# Patient Record
Sex: Male | Born: 1960 | Race: White | Hispanic: No | Marital: Married | Smoking: Former smoker
Health system: Southern US, Community
[De-identification: ages and names within clinical notes are randomized; demographics above are authoritative.]

## PROBLEM LIST (undated history)

## (undated) DIAGNOSIS — E785 Hyperlipidemia, unspecified: Secondary | ICD-10-CM

## (undated) DIAGNOSIS — M5030 Other cervical disc degeneration, unspecified cervical region: Secondary | ICD-10-CM

## (undated) DIAGNOSIS — E079 Disorder of thyroid, unspecified: Secondary | ICD-10-CM

## (undated) DIAGNOSIS — E559 Vitamin D deficiency, unspecified: Secondary | ICD-10-CM

## (undated) DIAGNOSIS — M109 Gout, unspecified: Secondary | ICD-10-CM

## (undated) DIAGNOSIS — I10 Essential (primary) hypertension: Secondary | ICD-10-CM

## (undated) HISTORY — PX: OTHER SURGICAL HISTORY: SHX169

## (undated) HISTORY — PX: POLYPECTOMY: SHX149

## (undated) HISTORY — DX: Vitamin D deficiency, unspecified: E55.9

## (undated) HISTORY — DX: Other cervical disc degeneration, unspecified cervical region: M50.30

## (undated) HISTORY — DX: Disorder of thyroid, unspecified: E07.9

## (undated) HISTORY — PX: HERNIA REPAIR: SHX51

## (undated) HISTORY — DX: Hyperlipidemia, unspecified: E78.5

## (undated) HISTORY — PX: BACK SURGERY: SHX140

## (undated) HISTORY — DX: Essential (primary) hypertension: I10

## (undated) HISTORY — DX: Gout, unspecified: M10.9

---

## 2005-07-28 ENCOUNTER — Ambulatory Visit (HOSPITAL_COMMUNITY): Admission: RE | Admit: 2005-07-28 | Discharge: 2005-07-28 | Payer: Self-pay | Admitting: Specialist

## 2006-06-08 ENCOUNTER — Emergency Department (HOSPITAL_COMMUNITY): Admission: EM | Admit: 2006-06-08 | Discharge: 2006-06-09 | Payer: Self-pay | Admitting: Emergency Medicine

## 2006-06-08 ENCOUNTER — Encounter: Admission: RE | Admit: 2006-06-08 | Discharge: 2006-06-08 | Payer: Self-pay | Admitting: Neurosurgery

## 2006-06-12 ENCOUNTER — Ambulatory Visit (HOSPITAL_COMMUNITY): Admission: RE | Admit: 2006-06-12 | Discharge: 2006-06-12 | Payer: Self-pay | Admitting: Neurosurgery

## 2006-07-09 ENCOUNTER — Ambulatory Visit (HOSPITAL_COMMUNITY): Admission: RE | Admit: 2006-07-09 | Discharge: 2006-07-10 | Payer: Self-pay | Admitting: Neurosurgery

## 2006-08-02 ENCOUNTER — Emergency Department (HOSPITAL_COMMUNITY): Admission: EM | Admit: 2006-08-02 | Discharge: 2006-08-02 | Payer: Self-pay | Admitting: Emergency Medicine

## 2006-09-20 ENCOUNTER — Encounter: Admission: RE | Admit: 2006-09-20 | Discharge: 2006-09-20 | Payer: Self-pay | Admitting: Neurosurgery

## 2007-11-01 ENCOUNTER — Ambulatory Visit (HOSPITAL_COMMUNITY): Admission: RE | Admit: 2007-11-01 | Discharge: 2007-11-01 | Payer: Self-pay | Admitting: Specialist
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
## 2009-04-26 ENCOUNTER — Encounter: Admission: RE | Admit: 2009-04-26 | Discharge: 2009-04-26 | Payer: Self-pay | Admitting: Neurosurgery

## 2009-06-18 ENCOUNTER — Ambulatory Visit (HOSPITAL_COMMUNITY): Admission: RE | Admit: 2009-06-18 | Discharge: 2009-06-19 | Payer: Self-pay | Admitting: Neurosurgery

## 2010-04-25 ENCOUNTER — Ambulatory Visit (HOSPITAL_COMMUNITY): Admission: RE | Admit: 2010-04-25 | Discharge: 2010-04-25 | Payer: Self-pay | Admitting: Specialist

## 2010-09-21 ENCOUNTER — Observation Stay (HOSPITAL_COMMUNITY)
Admission: RE | Admit: 2010-09-21 | Discharge: 2010-09-22 | Payer: Self-pay | Source: Home / Self Care | Attending: Specialist | Admitting: Specialist

## 2010-09-21 ENCOUNTER — Encounter (INDEPENDENT_AMBULATORY_CARE_PROVIDER_SITE_OTHER): Payer: Self-pay | Admitting: Specialist

## 2010-12-27 LAB — COMPREHENSIVE METABOLIC PANEL
ALT: 23 U/L (ref 0–53)
AST: 20 U/L (ref 0–37)
Albumin: 4.6 g/dL (ref 3.5–5.2)
Alkaline Phosphatase: 60 U/L (ref 39–117)
BUN: 13 mg/dL (ref 6–23)
CO2: 28 mEq/L (ref 19–32)
Calcium: 10 mg/dL (ref 8.4–10.5)
Chloride: 103 mEq/L (ref 96–112)
Creatinine, Ser: 1.01 mg/dL (ref 0.4–1.5)
GFR calc Af Amer: >60 mL/min (ref >60)
GFR calc non Af Amer: >60 mL/min (ref >60)
Glucose, Bld: 93 mg/dL (ref 70–99)
Potassium: 3.9 mEq/L (ref 3.5–5.1)
Sodium: 140 mEq/L (ref 135–145)
Total Bilirubin: 0.8 mg/dL (ref 0.3–1.2)
Total Protein: 8 g/dL (ref 6.0–8.3)

## 2010-12-27 LAB — PROTIME-INR
INR: 0.97 (ref 0.00–1.49)
Prothrombin Time: 13.1 seconds (ref 11.6–15.2)

## 2010-12-27 LAB — CBC
HCT: 44.9 % (ref 39.0–52.0)
Hemoglobin: 16.4 g/dL (ref 13.0–17.0)
MCH: 32.6 pg (ref 26.0–34.0)
MCHC: 36.5 g/dL — ABNORMAL HIGH (ref 30.0–36.0)
MCV: 89.3 fL (ref 78.0–100.0)
Platelets: 206 10*3/uL (ref 150–400)
RBC: 5.03 MIL/uL (ref 4.22–5.81)
RDW: 12.9 % (ref 11.5–15.5)
WBC: 5.1 10*3/uL (ref 4.0–10.5)

## 2010-12-27 LAB — URINALYSIS, ROUTINE W REFLEX MICROSCOPIC
Bilirubin Urine: NEGATIVE
Glucose, UA: NEGATIVE mg/dL
Hgb urine dipstick: NEGATIVE
Ketones, ur: NEGATIVE mg/dL
Nitrite: NEGATIVE
Protein, ur: NEGATIVE mg/dL
Specific Gravity, Urine: 1.009 (ref 1.005–1.030)
Urobilinogen, UA: 0.2 mg/dL (ref 0.0–1.0)
pH: 6 (ref 5.0–8.0)

## 2010-12-27 LAB — SURGICAL PCR SCREEN
MRSA, PCR: NEGATIVE
Staphylococcus aureus: NEGATIVE

## 2011-01-20 LAB — BASIC METABOLIC PANEL
CO2: 28 mEq/L (ref 19–32)
Calcium: 10.1 mg/dL (ref 8.4–10.5)
Chloride: 103 mEq/L (ref 96–112)
Creatinine, Ser: 0.95 mg/dL (ref 0.4–1.5)
GFR calc Af Amer: >60 mL/min (ref >60)
Glucose, Bld: 80 mg/dL (ref 70–99)

## 2011-01-20 LAB — CBC
Hemoglobin: 16 g/dL (ref 13.0–17.0)
MCHC: 35.6 g/dL (ref 30.0–36.0)
MCV: 93.1 fL (ref 78.0–100.0)
RBC: 4.83 MIL/uL (ref 4.22–5.81)
RDW: 12.8 % (ref 11.5–15.5)

## 2011-01-20 LAB — DIFFERENTIAL
Basophils Absolute: 0 10*3/uL (ref 0.0–0.1)
Basophils Relative: 1 % (ref 0–1)
Eosinophils Absolute: 0.1 10*3/uL (ref 0.0–0.7)
Monocytes Absolute: 0.4 10*3/uL (ref 0.1–1.0)
Monocytes Relative: 8 % (ref 3–12)
Neutro Abs: 3.4 10*3/uL (ref 1.7–7.7)

## 2011-03-03 NOTE — Op Note (Signed)
NAME:  Sean Bell, Sean Bell NO.:  [id number]   MEDICAL RECORD NO.:  [id number]          PATIENT TYPE:  OIB   LOCATION:  3039                         FACILITY:  [facility]   PHYSICIAN:  Kathaleen Maser. Pool, M.D.    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  Right C6-7  herniated pulposus with radiculopathy.   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  Right C6-7  herniated pulposus with radiculopathy.   PROCEDURE:  C5-6 at C6-7 anterior cervical diskectomy and fusion with  allograft and plating.   SURGEON:  Kathaleen Maser. Pool, M.D.   ASSISTANT:  Donalee Citrin, M.D.   ANESTHESIA:  General orotracheal.   INDICATIONS FOR PROCEDURE:  Mr. Sheek is a 50 year old male with history of  severe neck and right upper extremity pain, paresthesias, and weakness  consistent primarily with a right-sided C7 radiculopathy, although there are  some elements of a right-sided C6 radiculopathy as well.  Workup  demonstrates evidence of significant spondylosis with foraminal stenosis at  C5-6.  The patient has evidence of a rather large preforaminal and foraminal  disk herniation off the right-side at C6-7 causing compression of the right-  sided T7 nerve root.  The patient has been counseled as to his options.  He  decided to proceed with a C5-6 and C6-7 anterior cervical diskectomy and  fusion with allograft and plating.   DESCRIPTION OF PROCEDURE:  The patient was taken to the operating room and  placed on the operative table in the supine position.  After adequate level  of anesthesia was achieved, the patient was positioned with the neck  slightly and head placed in halter traction.  The patient's anterior  cervical region was prepped and draped sterilely.  A 10 blade was used to  make a skin incision overlying the C6 vertebral level.   This was carried  down sharply to the platysma.  The platysma was then divided vertically, and  dissection proceeded along the medial border of the sternomastoid muscle and  carotid sheath.  The trachea and esophagus were mobilized and retracted  towards the left.  The prevertebral fascia was stripped off the anterior  spinal column.  The longus colli muscle was then elevated bilaterally.  A  deep self-retaining retractor was placed.  Intraoperative fluoroscopy was  used, and the C5-6 and C6-7 levels were confirmed.  Both disk spaces were  then incised with a 15 blade in rectangular fashion.  A wide disk space  clean-out was then achieved using pituitary rongeurs, forward and back-  angled Karlin curettes, Kerrison rongeurs, and a high-speed drill.  All  elements of the disk were removed down to the level of the posterior  annulus.  The microscope was then brought into the field and used throughout  the remainder of the diskectomy.  The remaining aspects of the annulus and  osteophytes were removed using the high-speed drill down to the level of the  posterior longitudinal ligament.  Posterior longitudinal ligament was then  elevated and resected in piecemeal fashion using  Kerrison rongeurs.  The  thecal sac was then identified.  A wide central decompression was then  performed by undercutting the bodies of C5-C6.  The decompression then  proceeded on each neural foramen.  Wide anterior foraminotomy was then  performed on first the exiting C6 nerve roots bilaterally.  At this point a  very thorough decompression had been achieved.  There is no evidence of  injury to the thecal sac or nerve roots.  The procedure then repeated at C6-  7, again without complication.  The findings at C6-7 were that of a rather  large rightward C6-7 disk herniation with compression to the right-sided C7  nerve root.  The wound was then irrigated with antibiotic solution.  Gelfoam  was placed topically for  hemostasis, and later Surgifoam was also used for  hemostasis which was finally achieved.  A 5-mm LifeNet allograft wedge was  then packed into place and recessed approximately 1 mL from the anterior  cortical margin.  At C5-6, a 6-mm LifeNet allograft wedge was then packed  into place at C6-7.  A 45-mm Atlantis anterior cervical plate was then  placed at the C5, C6, and C7 levels.  This then attached under fluoroscopic  guidance using 13-mm bare metal screws.  All screws were given final  tightening and found be solidly in bone.  The locking screws were engaged at  all three levels.  The final images revealed good position of the bone  grafts and the hardware at the proper operative levels and normal alignment  of the spine.  The wound was then irrigated with antibiotic solution.  Hemostasis was ensured.  The wound was then closed in typical fashion.  Steri-Strips and sterile dressings were applied.  There were no operative  complications.  The patient awoke and he returned to the recovery room  postoperatively.           ______________________________  Kathaleen Maser Pool, M.D.     HAP/MEDQ  D:  07/09/2006  T:  07/11/2006  Job:  [id number]

## 2011-03-05 NOTE — Discharge Summary (Signed)
  NAME:  Sean Bell, Sean Bell NO.:  [id number]  MEDICAL RECORD NO.:  [id number]           PATIENT TYPE:  LOCATION:                                 FACILITY:  PHYSICIAN:  Jene Every, M.D.    DATE OF BIRTH:  06/21/1961  DATE OF ADMISSION:  09/21/2010 DATE OF DISCHARGE:  09/22/2010                              DISCHARGE SUMMARY   ADMISSION DIAGNOSIS:  Spinal stenosis and herniated nucleus pulposus at L4-L5 on the left.  DISCHARGE DIAGNOSIS:  Spinal stenosis and herniated nucleus pulposus at L4-L5 on the left status post lumbar decompression with microdiskectomy at L4-L5 on the left.  PROCEDURE:  The patient was taken to the OR, underwent lateral recess decompression with foraminotomy at L4-L5 on the left with microdiskectomy at L4-L5 on the left.  Surgeon was Dr. Jene Every. Assistant was [employer], [employer].  Anesthesia was general. Complications were none.  Hospital course was uneventful. DISPOSITION:  The patient stable to be discharged home on postop day #1. He is to follow up with Dr. Shelle Iron in approximately 10 to 14 days for suture removal.  ACTIVITIES:  He is to walk as tolerated utilizing back precautions.  DISCHARGE MEDICATIONS:  As per med rec sheet.  CONDITION ON DISCHARGE:  Stable.  FINAL DIAGNOSIS:  Doing well status post lumbar decompression and microdiskectomy at L4-L5 on the left.     Roma Schanz, P.A.   ______________________________ Jene Every, M.D.    CS/MEDQ  D:  01/17/2011  T:  01/17/2011  Job:  [id number]  Electronically Signed by Roma Schanz P.A. on 02/24/2011 09:25:31 AM Electronically Signed by Jene Every M.D. on 03/05/2011 01:29:25 PM

## 2012-01-22 IMAGING — CR DG LUMBAR SPINE 2-3V
2 series · 2 of 2 positions shown · non-contrast
Comparison: None.

CLINICAL DATA: Preoperative exam.  Spinal stenosis.

LUMBAR SPINE - 2-3 VIEW

[t l-spine a.p.]
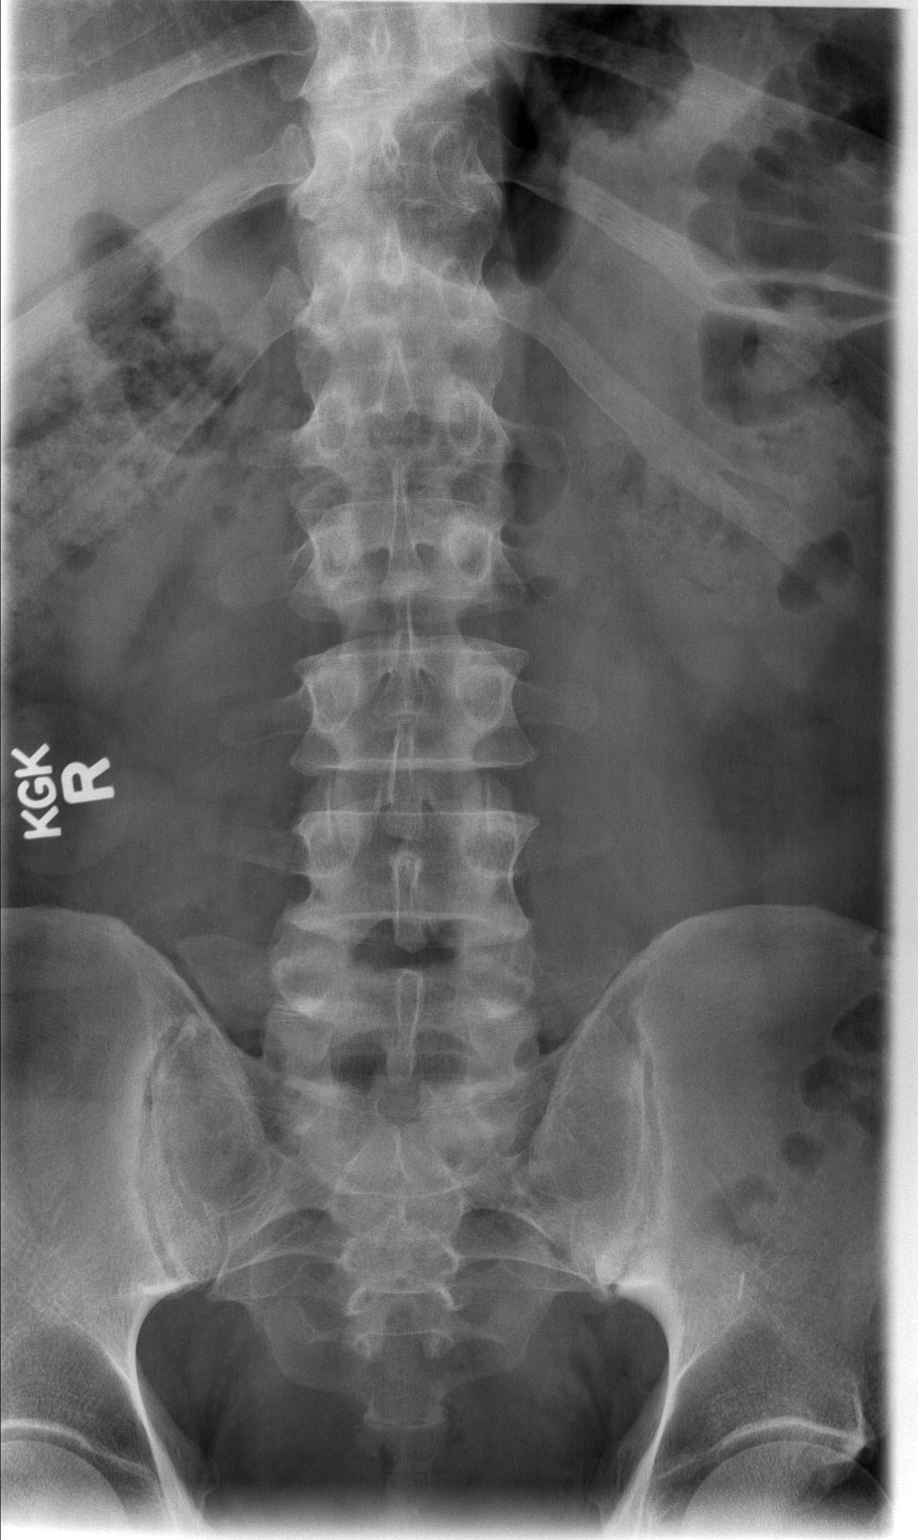

[t l-spine lat]
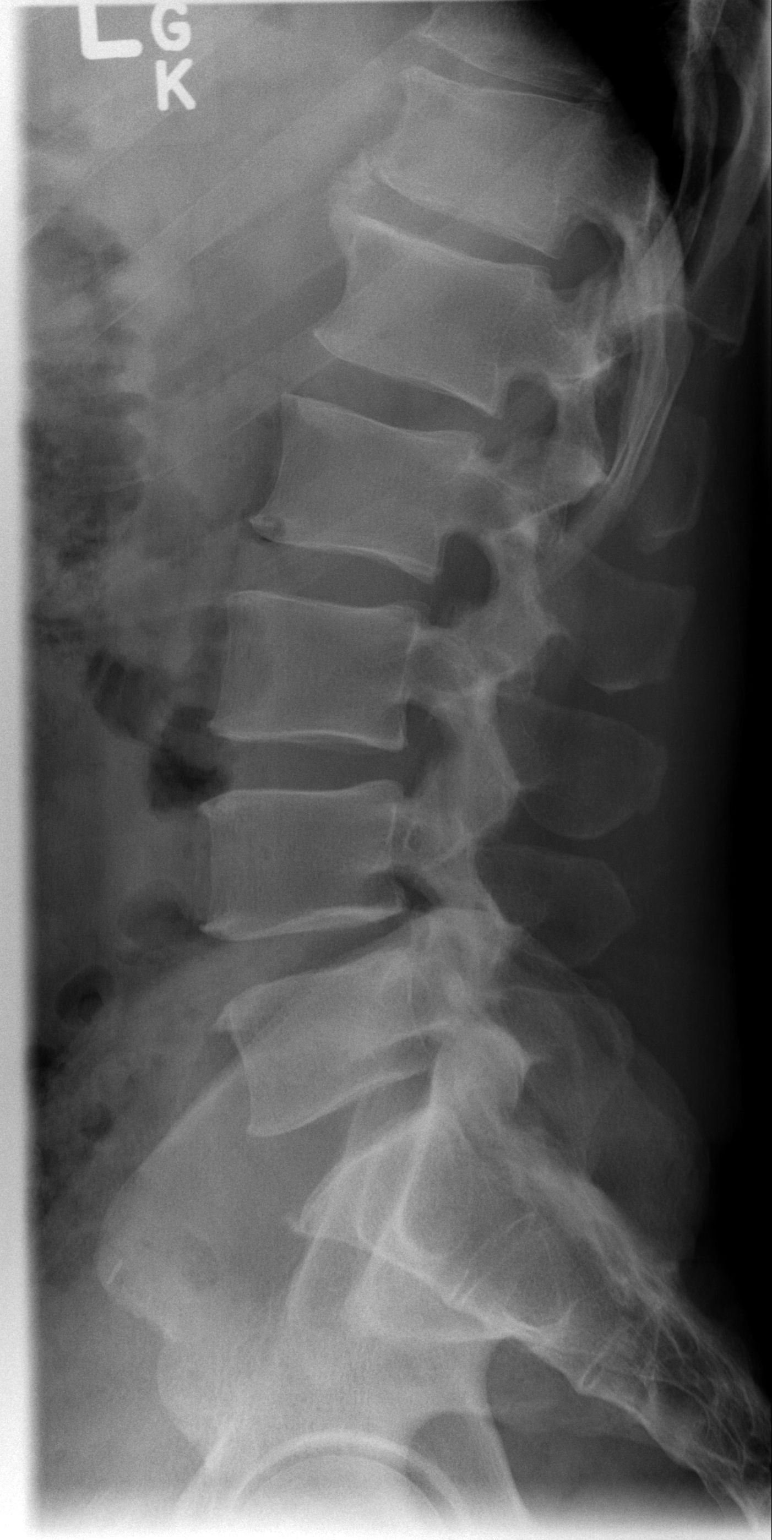

[2 of 2 positions shown; findings below may reference images not displayed]

FINDINGS: The present exam is labeled with the last fully open disc
space L5-S1.  Correlation with any outside films recommended prior
to any intervention.

Mild L4-5 disc space narrowing.  The canal appears congenitally
narrowed at the L4-5 and L5-S1 level.  Minimal degenerative changes
T11-12 and T12-L1.

Mild left-sided sacroiliac joint degenerative changes.  Surgical
clip projects over the left ilium.
IMPRESSION: The present exam is labeled with the last fully open disc space L5-
S1.  Correlation with any outside films recommended prior to any
intervention.

## 2014-02-13 DIAGNOSIS — M792 Neuralgia and neuritis, unspecified: Secondary | ICD-10-CM | POA: Insufficient documentation

## 2014-02-13 DIAGNOSIS — M961 Postlaminectomy syndrome, not elsewhere classified: Secondary | ICD-10-CM | POA: Insufficient documentation

## 2014-02-13 DIAGNOSIS — G894 Chronic pain syndrome: Secondary | ICD-10-CM | POA: Insufficient documentation

## 2021-02-03 ENCOUNTER — Telehealth: Payer: Self-pay | Admitting: Oncology

## 2021-02-03 NOTE — Telephone Encounter (Signed)
Patient referred by Terrilyn Saver, NP for Atypical Lymphocytes.  Appt made for 02/11/21 Labs 10:30 am - Consult 11:00 am

## 2021-02-03 NOTE — Telephone Encounter (Signed)
Per Alvis Lemmings at [facility] [employer] [facility] [employer], patient does not need Authorization for this Visit.  Reference Number [id number]

## 2021-02-09 ENCOUNTER — Telehealth: Payer: Self-pay | Admitting: Oncology

## 2021-02-09 NOTE — Telephone Encounter (Signed)
Patient rescheduled from 4/29 to 5/5 Labs 2:45 pm - Consult 3:15 pm for Atypical Lymphocytes

## 2021-02-11 ENCOUNTER — Other Ambulatory Visit: Payer: Self-pay

## 2021-02-11 ENCOUNTER — Encounter: Payer: Self-pay | Admitting: Oncology

## 2021-02-15 ENCOUNTER — Telehealth: Payer: Self-pay | Admitting: Oncology

## 2021-02-15 DIAGNOSIS — K4091 Unilateral inguinal hernia, without obstruction or gangrene, recurrent: Secondary | ICD-10-CM | POA: Insufficient documentation

## 2021-02-15 NOTE — Telephone Encounter (Signed)
Patient referred by Terrilyn Saver, NP for Atypical Lymphocyte.  Appt made 02/17/21 Labs 10:30 am - Consult 11:00 am

## 2021-02-16 ENCOUNTER — Other Ambulatory Visit: Payer: Self-pay | Admitting: Oncology

## 2021-02-16 DIAGNOSIS — D72820 Lymphocytosis (symptomatic): Secondary | ICD-10-CM
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
## 2021-02-17 ENCOUNTER — Other Ambulatory Visit: Payer: Self-pay | Admitting: Hematology and Oncology

## 2021-02-17 ENCOUNTER — Encounter: Payer: Self-pay | Admitting: Oncology

## 2021-02-17 ENCOUNTER — Inpatient Hospital Stay (INDEPENDENT_AMBULATORY_CARE_PROVIDER_SITE_OTHER): Payer: Medicare HMO | Admitting: Oncology

## 2021-02-17 ENCOUNTER — Inpatient Hospital Stay: Payer: Medicare HMO | Attending: Oncology

## 2021-02-17 ENCOUNTER — Other Ambulatory Visit: Payer: Self-pay

## 2021-02-17 DIAGNOSIS — D7289 Other specified disorders of white blood cells: Secondary | ICD-10-CM

## 2021-02-17 DIAGNOSIS — D72820 Lymphocytosis (symptomatic): Secondary | ICD-10-CM

## 2021-02-17 LAB — CBC
MCV: 93 (ref 80–94)
RBC: 4.97 (ref 3.87–5.11)

## 2021-02-17 LAB — CBC AND DIFFERENTIAL
HCT: 16 — AB (ref 41–53)
Hemoglobin: 15.5 (ref 13.5–17.5)
Neutrophils Absolute: 3.48
Platelets: 270 (ref 150–399)
WBC: 5.9

## 2021-03-13 DIAGNOSIS — D7289 Other specified disorders of white blood cells: Secondary | ICD-10-CM | POA: Insufficient documentation

## 2021-04-29 ENCOUNTER — Telehealth: Payer: Self-pay

## 2021-04-29 ENCOUNTER — Other Ambulatory Visit: Payer: Self-pay | Admitting: Neurosurgery

## 2021-04-29 DIAGNOSIS — M5412 Radiculopathy, cervical region: Secondary | ICD-10-CM

## 2021-04-29 NOTE — Progress Notes (Signed)
Phone call to patient to verify medication list and allergies for myelogram procedure. Pt instructed to hold Armodafinil (nuvigil) and  Cymbalta for 48hrs prior to myelogram appointment time and 24 hours after appointment. Pt also instructed to have a driver the day of the procedure, the procedure would take around 2 hours, and discharge instructions discussed. Pt verbalized understanding.

## 2021-05-02 ENCOUNTER — Ambulatory Visit
Admission: RE | Admit: 2021-05-02 | Discharge: 2021-05-02 | Disposition: A | Discharge status: Home / Self Care | Payer: Medicare HMO | Source: Ambulatory Visit | Attending: Neurosurgery | Admitting: Neurosurgery

## 2021-05-02 DIAGNOSIS — M5412 Radiculopathy, cervical region: Secondary | ICD-10-CM

## 2021-05-02 MED ORDER — IOPAMIDOL (ISOVUE-M 300) INJECTION 61%
10.0000 mL | Freq: Once | INTRAMUSCULAR | Status: AC
  Administered 2021-05-02: 10 mL via INTRATHECAL

## 2021-05-02 MED ORDER — DIAZEPAM 5 MG PO TABS
10.0000 mg | ORAL_TABLET | Freq: Once | ORAL | Status: AC
  Administered 2021-05-02: 10 mg via ORAL

## 2021-05-02 MED ORDER — MEPERIDINE HCL 50 MG/ML IJ SOLN
50.0000 mg | Freq: Once | INTRAMUSCULAR | Status: AC
  Administered 2021-05-02: 50 mg via INTRAMUSCULAR

## 2021-05-02 MED ORDER — ONDANSETRON HCL 4 MG/2ML IJ SOLN
4.0000 mg | Freq: Once | INTRAMUSCULAR | Status: AC
  Administered 2021-05-02: 4 mg via INTRAMUSCULAR

## 2021-05-02 NOTE — Discharge Instr - Other Orders (Addendum)
1015: pt reports pain 7/10 in hips and back from myelogram procedure. See MAR.  1055: Pt reports relief in pain. Ranking pain 4/10, tolerable. Helped pt to reposition.

## 2021-05-02 NOTE — Progress Notes (Signed)
Pt reports he has been off of his Nuvigil and Cymbalta for at least 48 hours.

## 2021-05-02 NOTE — Discharge Instructions (Signed)
Myelogram Discharge Instructions  Go home and rest quietly as needed. You may resume normal activities; however, do not exert yourself strongly or do any heavy lifting today and tomorrow.   DO NOT drive today.    You may resume your normal diet and medications unless otherwise indicated. Drink lots of extra fluids today and tomorrow.   The incidence of headache, nausea, or vomiting is about 5% (one in 20 patients).  If you develop a headache, lie flat for 24 hours and drink plenty of fluids until the headache goes away.  Caffeinated beverages may be helpful. If when you get up you still have a headache when standing, go back to bed and force fluids for another 24 hours.   If you develop severe nausea and vomiting or a headache that does not go away with the flat bedrest after 48 hours, please call [phone].   Call your physician for a follow-up appointment.  The results of your myelogram will be sent directly to your physician by the following day.  If you have any questions or if complications develop after you arrive home, please call [phone].  Discharge instructions have been explained to the patient.  The patient, or the person responsible for the patient, fully understands these instructions.   Thank you for visiting our office today.    YOU MAY RESUME YOUR NUVIGIL AND CYMBALTA 24 HOURS AFTER PROCEDURE (05/03/21 AT 9:30 AM OR AFTER)
